# Patient Record
Sex: Male | Born: 1985 | Race: White | Hispanic: No | Marital: Married | Smoking: Never smoker
Health system: Southern US, Community
[De-identification: ages and names within clinical notes are randomized; demographics above are authoritative.]

---
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
## 2003-02-01 ENCOUNTER — Emergency Department (HOSPITAL_COMMUNITY): Admission: EM | Admit: 2003-02-01 | Discharge: 2003-02-01 | Payer: Self-pay | Admitting: Emergency Medicine

## 2003-02-25 ENCOUNTER — Emergency Department (HOSPITAL_COMMUNITY): Admission: EM | Admit: 2003-02-25 | Discharge: 2003-02-25 | Payer: Self-pay | Admitting: Emergency Medicine

## 2003-09-14 ENCOUNTER — Emergency Department (HOSPITAL_COMMUNITY): Admission: EM | Admit: 2003-09-14 | Discharge: 2003-09-15 | Payer: Self-pay | Admitting: Emergency Medicine

## 2003-09-17 ENCOUNTER — Emergency Department (HOSPITAL_COMMUNITY): Admission: EM | Admit: 2003-09-17 | Discharge: 2003-09-18 | Payer: Self-pay | Admitting: Emergency Medicine

## 2004-06-14 ENCOUNTER — Emergency Department (HOSPITAL_COMMUNITY): Admission: EM | Admit: 2004-06-14 | Discharge: 2004-06-14 | Payer: Self-pay | Admitting: Emergency Medicine

## 2004-06-18 ENCOUNTER — Emergency Department (HOSPITAL_COMMUNITY): Admission: EM | Admit: 2004-06-18 | Discharge: 2004-06-18 | Payer: Self-pay | Admitting: Emergency Medicine

## 2004-06-22 ENCOUNTER — Emergency Department (HOSPITAL_COMMUNITY): Admission: EM | Admit: 2004-06-22 | Discharge: 2004-06-23 | Payer: Self-pay | Admitting: *Deleted

## 2008-08-05 ENCOUNTER — Emergency Department (HOSPITAL_COMMUNITY): Admission: EM | Admit: 2008-08-05 | Discharge: 2008-08-05 | Payer: Self-pay | Admitting: Emergency Medicine

## 2008-09-21 ENCOUNTER — Emergency Department (HOSPITAL_COMMUNITY): Admission: EM | Admit: 2008-09-21 | Discharge: 2008-09-21 | Payer: Self-pay | Admitting: Emergency Medicine

## 2008-12-30 ENCOUNTER — Emergency Department (HOSPITAL_COMMUNITY): Admission: EM | Admit: 2008-12-30 | Discharge: 2008-12-31 | Payer: Self-pay | Admitting: Emergency Medicine

## 2010-03-31 ENCOUNTER — Emergency Department (HOSPITAL_COMMUNITY): Payer: PRIVATE HEALTH INSURANCE

## 2010-03-31 ENCOUNTER — Emergency Department (HOSPITAL_COMMUNITY)
Admission: EM | Admit: 2010-03-31 | Discharge: 2010-04-01 | Disposition: A | Discharge status: Home / Self Care | Payer: PRIVATE HEALTH INSURANCE | Attending: Emergency Medicine | Admitting: Emergency Medicine

## 2010-03-31 DIAGNOSIS — S335XXA Sprain of ligaments of lumbar spine, initial encounter: Secondary | ICD-10-CM | POA: Insufficient documentation

## 2010-03-31 DIAGNOSIS — Y92009 Unspecified place in unspecified non-institutional (private) residence as the place of occurrence of the external cause: Secondary | ICD-10-CM | POA: Insufficient documentation

## 2010-03-31 DIAGNOSIS — M542 Cervicalgia: Secondary | ICD-10-CM | POA: Insufficient documentation

## 2010-03-31 DIAGNOSIS — M545 Low back pain, unspecified: Secondary | ICD-10-CM | POA: Insufficient documentation

## 2010-03-31 DIAGNOSIS — W010XXA Fall on same level from slipping, tripping and stumbling without subsequent striking against object, initial encounter: Secondary | ICD-10-CM | POA: Insufficient documentation

## 2010-03-31 DIAGNOSIS — R51 Headache: Secondary | ICD-10-CM | POA: Insufficient documentation

## 2010-04-05 ENCOUNTER — Emergency Department (HOSPITAL_COMMUNITY): Payer: PRIVATE HEALTH INSURANCE

## 2010-04-05 ENCOUNTER — Emergency Department (HOSPITAL_COMMUNITY)
Admission: EM | Admit: 2010-04-05 | Discharge: 2010-04-05 | Disposition: A | Discharge status: Home / Self Care | Payer: PRIVATE HEALTH INSURANCE | Attending: Emergency Medicine | Admitting: Emergency Medicine

## 2010-04-05 DIAGNOSIS — R51 Headache: Secondary | ICD-10-CM | POA: Insufficient documentation

## 2010-04-05 DIAGNOSIS — S0003XA Contusion of scalp, initial encounter: Secondary | ICD-10-CM | POA: Insufficient documentation

## 2010-04-05 DIAGNOSIS — M545 Low back pain, unspecified: Secondary | ICD-10-CM | POA: Insufficient documentation

## 2010-04-05 DIAGNOSIS — Y92009 Unspecified place in unspecified non-institutional (private) residence as the place of occurrence of the external cause: Secondary | ICD-10-CM | POA: Insufficient documentation

## 2010-04-05 DIAGNOSIS — S1093XA Contusion of unspecified part of neck, initial encounter: Secondary | ICD-10-CM | POA: Insufficient documentation

## 2010-04-05 DIAGNOSIS — W19XXXA Unspecified fall, initial encounter: Secondary | ICD-10-CM | POA: Insufficient documentation

## 2010-04-05 DIAGNOSIS — M549 Dorsalgia, unspecified: Secondary | ICD-10-CM | POA: Insufficient documentation

## 2010-06-01 NOTE — Op Note (Signed)
[facility]  Patient:    Alan Ryan, Alan Ryan Visit Number: [id number] MRN: [id number]          Service Type: DSU Location: DAY Attending Physician:  Barbaraann Barthel Dictated by:   Barbaraann Barthel, M.D. Proc. Date: 11/26/00 Admit Date:  11/26/2000                             Operative Report  PREOPERATIVE DIAGNOSIS:  Left groin mass.  POSTOPERATIVE DIAGNOSIS:  Left groin mass.  OPERATION:  Biopsy of left groin node.  SURGEON:  Barbaraann Barthel, M.D.  NOTE: This is a 25 year old white male who had presented to the office with hard palpable lymph nodes primarily in the left groin.  These were nontender, and there were no other lymph nodes palpable other than primarily in his left groin side.  He had several large, hard lymph nodes palpated in this area. The patient had no history of any unusual fevers, night sweats, fatigue, change of appetite, rashes, or any other systemic symptoms.  The patient did have a history of a penile piercing, and he presented to the office with a stainless steel bolt through the prepuce of his penis.  This was not infected. Chest x-ray was normal, and laboratory work otherwise was grossly within normal limits.  I discussed the need for biopsy in one of these hard lymph nodes for diagnosis, and informed consent was obtained.  We discussed complications not limited to but including bleeding and infection and the possibility that more surgery might be required.  Informed consent was obtained with the patient and his mother.  GROSS OPERATIVE FINDINGS:  The patient had a fleshy-appearing lymph node that was hard in his left groin area.  This was approximately the size of an almond.  He had larger other lymph nodes, but I chose this particular lymph node for easy accessibility.  TECHNIQUE:  The patient was placed in the supine position.  After the adequate administration of general anesthesia by endotracheal intubation,  his left groin area was shaved and prepped with Betadine solution and draped in the usual manner.  The palpable lymph node was marked with a sterile marking pen, and a transverse incision was carried out over this palpable mass, and this was removed, ligating the pedicle with 2-0 Polysorb sutures.  The wound was then irrigated with normal saline solution and closed with 4-0 Polysorb sutures with Steri-Strips used to further approximate the skin.  Sterile dressing with Neosporin and 4 x 4 was applied.  The specimen was sent as a fresh specimen as previously discussed preoperatively with the pathologist. Final pathology is pending. Dictated by:   Barbaraann Barthel, M.D. Attending Physician:  Barbaraann Barthel DD:  11/26/00 TD:  11/26/00 Job: [id number]

## 2013-02-13 IMAGING — CT CT HEAD W/O CM
4 of 5 series · 12 of 47 positions shown, 13 images · non-contrast
Comparison: CT head and cervical spine 03/31/2010

CT HEAD

CLINICAL DATA: Trauma to head.  Struck forehead on sink.  Headache
and posterior neck pain.

CT HEAD WITHOUT CONTRAST
CT CERVICAL SPINE WITHOUT CONTRAST
TECHNIQUE: Multidetector CT imaging of the head and cervical spine
was performed following the standard protocol without intravenous
contrast.  Multiplanar CT image reconstructions of the cervical
spine were also generated.

[Series 2: headseq 4.8 h37s · axial · 0.47mm/px · z∈[+243,+313]mm · 3 of 30 slices shown, 4 images]
[im 8/30  brain]
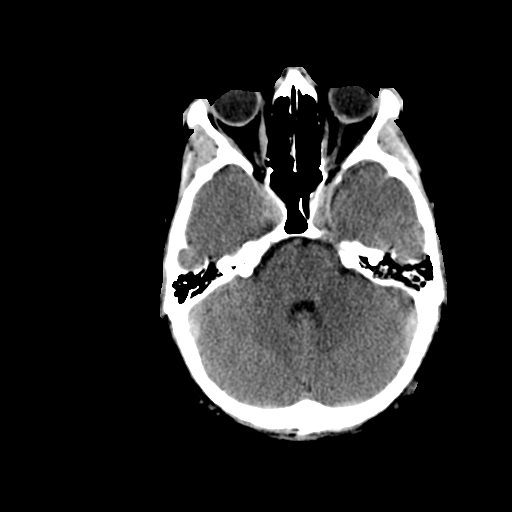
[im 8/30  bone]
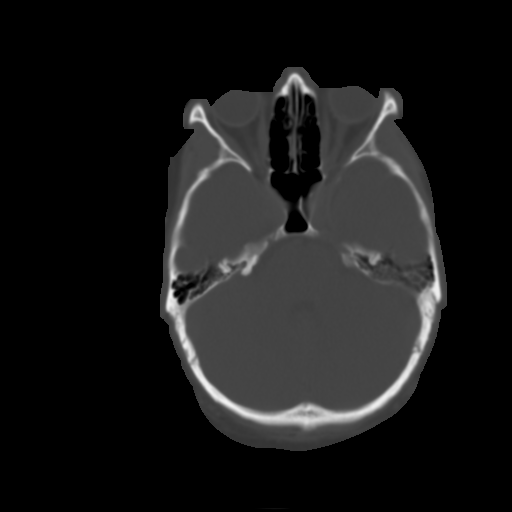
[im 15/30  brain]
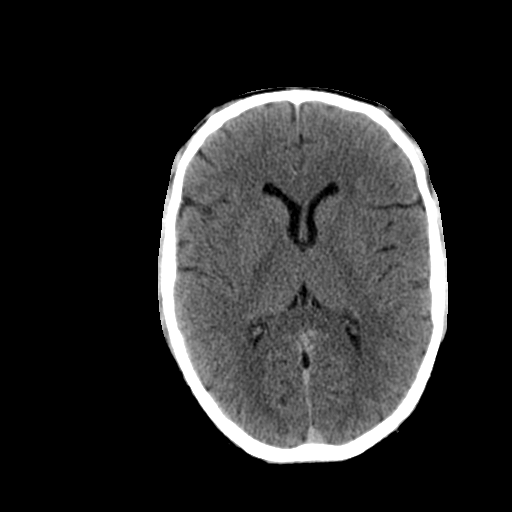
[im 22/30  brain]
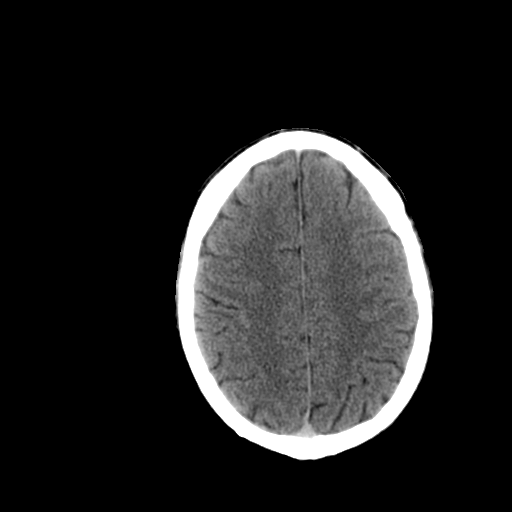

[Series 7: sagittal bone 2.0 · sagittal · 0.16mm/px · 3 of 35 slices shown]
[im 12/35  brain]
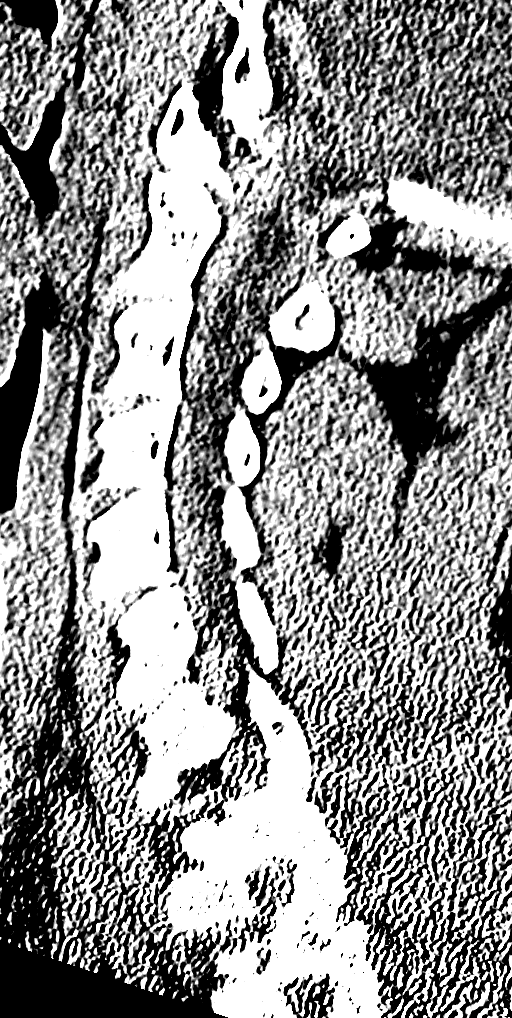
[im 18/35  brain]
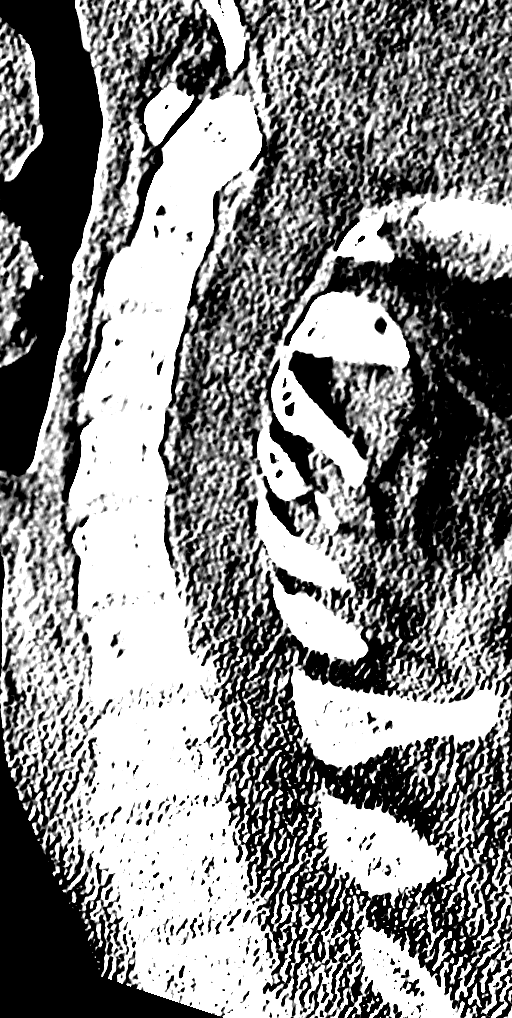
[im 23/35  brain]
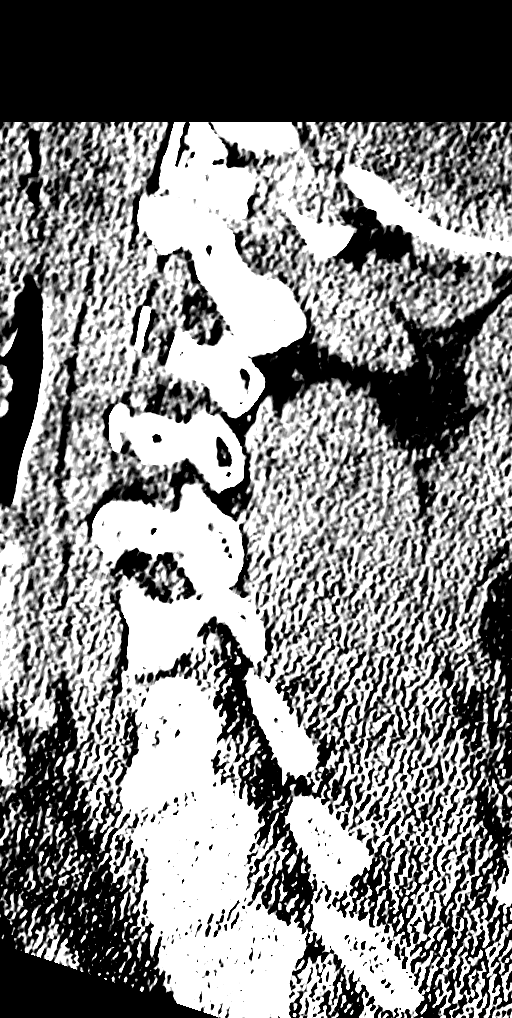

[Series 8: coronal bone 2.0 · coronal · 0.15mm/px · 3 of 43 slices shown]
[im 15/43  brain]
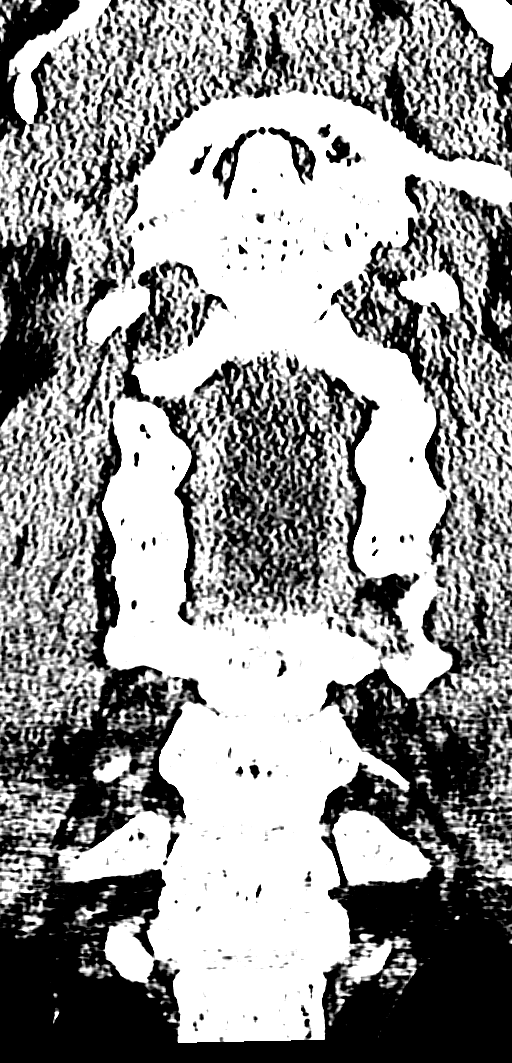
[im 19/43  brain]
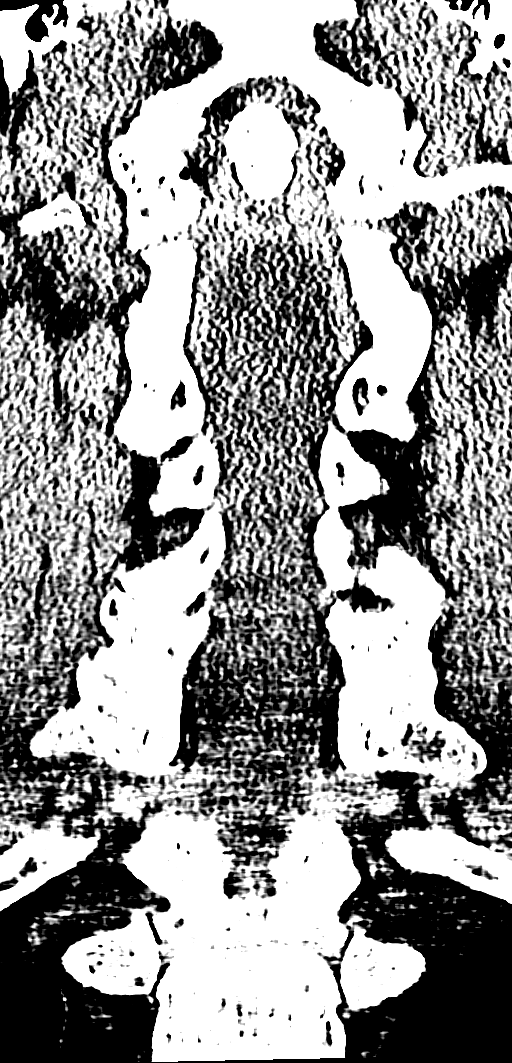
[im 24/43  brain]
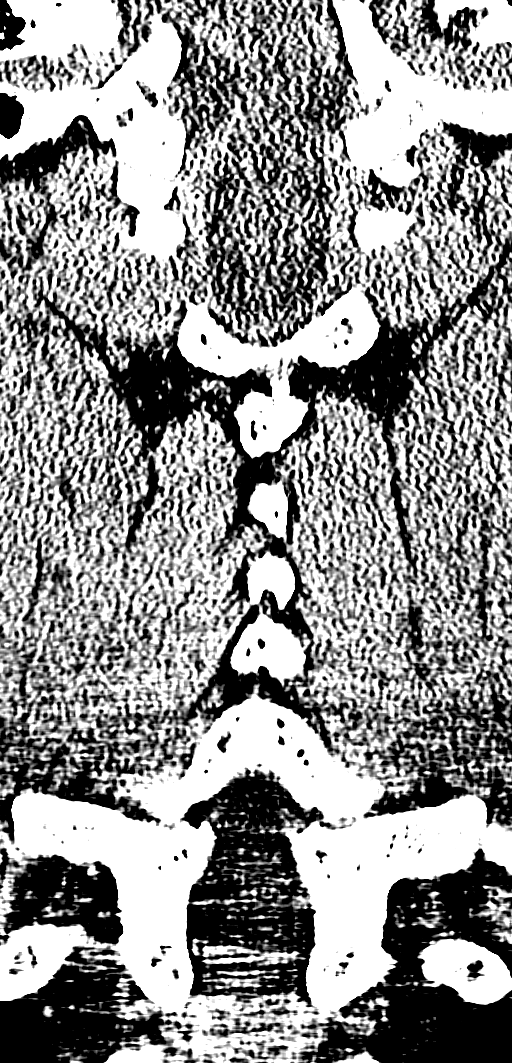

[Series 9: axial bone 2.0 · axial · 0.17mm/px · z∈[+39,+80]mm · 3 of 81 slices shown]
[im 8/81  bone]
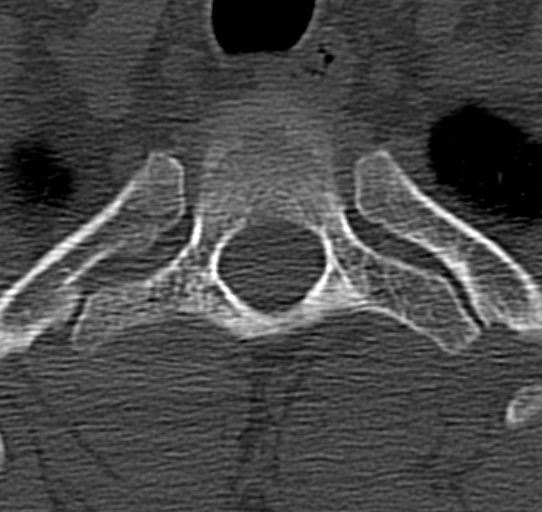
[im 15/81  bone]
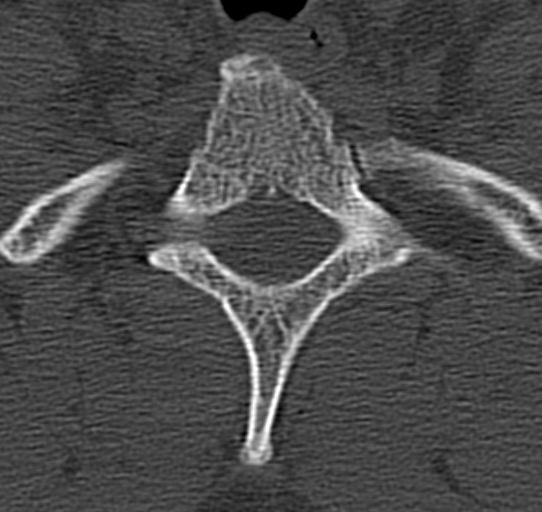
[im 30/81  bone]
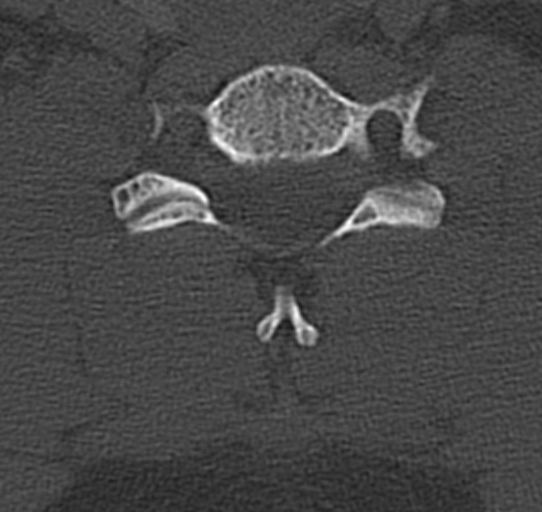

[12 of 47 positions shown; findings below may reference images not displayed]

FINDINGS: No acute cortical infarct, hemorrhage, or mass lesion is
present.  The ventricles are normal size.  No significant extra-
axial fluid collection is present.  The paranasal sinuses and
mastoid air cells are clear.  The osseous skull is intact.
Rightward nasal deviation is stable.  No acute extracranial injury
is evident.
IMPRESSION: Negative CT of the head.

CT CERVICAL SPINE
FINDINGS: The cervical spine is imaged from the skull base through
T1-2.  The vertebral body heights and alignment are maintained.  No
acute fracture or traumatic subluxation is evident.  An azygos
fissure is evident.  The lung apices are otherwise clear.
IMPRESSION: No acute abnormality of the cervical spine.

## 2020-07-05 ENCOUNTER — Emergency Department (HOSPITAL_COMMUNITY)
Admission: EM | Admit: 2020-07-05 | Discharge: 2020-07-05 | Disposition: A | Discharge status: Home / Self Care | Payer: PRIVATE HEALTH INSURANCE | Attending: Emergency Medicine | Admitting: Emergency Medicine

## 2020-07-05 ENCOUNTER — Encounter (HOSPITAL_COMMUNITY): Payer: Self-pay

## 2020-07-05 ENCOUNTER — Other Ambulatory Visit: Payer: Self-pay

## 2020-07-05 DIAGNOSIS — T63444A Toxic effect of venom of bees, undetermined, initial encounter: Secondary | ICD-10-CM

## 2020-07-05 DIAGNOSIS — T63441A Toxic effect of venom of bees, accidental (unintentional), initial encounter: Secondary | ICD-10-CM | POA: Insufficient documentation

## 2020-07-05 DIAGNOSIS — R000 Tachycardia, unspecified: Secondary | ICD-10-CM | POA: Insufficient documentation

## 2020-07-05 DIAGNOSIS — R1310 Dysphagia, unspecified: Secondary | ICD-10-CM | POA: Insufficient documentation

## 2020-07-05 DIAGNOSIS — T7840XA Allergy, unspecified, initial encounter: Secondary | ICD-10-CM

## 2020-07-05 DIAGNOSIS — R109 Unspecified abdominal pain: Secondary | ICD-10-CM | POA: Insufficient documentation

## 2020-07-05 DIAGNOSIS — R21 Rash and other nonspecific skin eruption: Secondary | ICD-10-CM | POA: Insufficient documentation

## 2020-07-05 MED ORDER — SODIUM CHLORIDE 0.9 % IV BOLUS
1000.0000 mL | Freq: Once | INTRAVENOUS | Status: AC
  Administered 2020-07-05: 1000 mL via INTRAVENOUS

## 2020-07-05 MED ORDER — EPINEPHRINE 0.3 MG/0.3ML IJ SOAJ
0.3000 mg | Freq: Once | INTRAMUSCULAR | Status: AC
  Administered 2020-07-05: 0.3 mg via INTRAMUSCULAR
  Filled 2020-07-05: qty 0.3

## 2020-07-05 MED ORDER — EPINEPHRINE 0.3 MG/0.3ML IJ SOAJ: 0.3000 mg | Freq: Once | INTRAMUSCULAR | Status: DC

## 2020-07-05 MED ORDER — FAMOTIDINE IN NACL 20-0.9 MG/50ML-% IV SOLN
20.0000 mg | Freq: Once | INTRAVENOUS | Status: AC
  Administered 2020-07-05: 20 mg via INTRAVENOUS
  Filled 2020-07-05: qty 50

## 2020-07-05 MED ORDER — ALBUTEROL SULFATE (2.5 MG/3ML) 0.083% IN NEBU
5.0000 mg | INHALATION_SOLUTION | Freq: Once | RESPIRATORY_TRACT | Status: AC
  Administered 2020-07-05: 5 mg via RESPIRATORY_TRACT
  Filled 2020-07-05: qty 6

## 2020-07-05 MED ORDER — ONDANSETRON HCL 4 MG/2ML IJ SOLN
4.0000 mg | Freq: Once | INTRAMUSCULAR | Status: AC
  Administered 2020-07-05: 4 mg via INTRAVENOUS
  Filled 2020-07-05: qty 2

## 2020-07-05 MED ORDER — DIPHENHYDRAMINE HCL 50 MG/ML IJ SOLN
25.0000 mg | Freq: Once | INTRAMUSCULAR | Status: AC
  Administered 2020-07-05: 25 mg via INTRAVENOUS
  Filled 2020-07-05: qty 1

## 2020-07-05 MED ORDER — METHYLPREDNISOLONE SODIUM SUCC 125 MG IJ SOLR
125.0000 mg | Freq: Once | INTRAMUSCULAR | Status: AC
  Administered 2020-07-05: 125 mg via INTRAVENOUS
  Filled 2020-07-05: qty 2

## 2020-07-05 MED ORDER — EPINEPHRINE 0.3 MG/0.3ML IJ SOAJ: 0.3000 mg | INTRAMUSCULAR | 1 refills | Status: AC | PRN

## 2020-07-05 MED ORDER — HYDROMORPHONE HCL 1 MG/ML IJ SOLN
0.5000 mg | Freq: Once | INTRAMUSCULAR | Status: AC
  Administered 2020-07-05: 0.5 mg via INTRAVENOUS
  Filled 2020-07-05: qty 1

## 2020-07-05 NOTE — ED Notes (Signed)
Pt educated on dilauidid medication administration and waiting four hours from admin time to driving as he is up for discharge. Pt reports he has no one else to drive him home and does not have transportation apps, denied using a cab. Pt reports he will walk home at this time after education and encouragement of waiting in ED.

## 2020-07-05 NOTE — ED Provider Notes (Signed)
[facility] EMERGENCY DEPARTMENT Provider Note   CSN: [id number] Arrival date & time: 07/05/20  1821     History Chief Complaint  Patient presents with   Insect Bite    Alan Ryan is a 35 y.o. male.  The history is provided by the patient. No language interpreter was used.  Allergic Reaction Presenting symptoms: difficulty breathing, difficulty swallowing, itching and rash   Severity:  Severe Duration:  20 minutes Prior allergic episodes:  Insect allergies Context: insect bite/sting   Relieved by:  Nothing Worsened by:  Nothing Ineffective treatments:  None tried   Pt complains of being stung by a bee.  Pt complains of a full body rash, itching, hands swelling and abdominal cramps.  Pt reports he had a previous reaction to bee stings in the past.   No past medical history on file.  There are no problems to display for this patient.        No family history on file.  Social History   Tobacco Use   Smoking status: Never   Smokeless tobacco: Never  Substance Use Topics   Alcohol use: Yes    Home Medications Prior to Admission medications   Not on File    Allergies    Bee venom, Shellfish allergy, and Sunflower seed oil [sunflower oil]  Review of Systems   Review of Systems  HENT:  Positive for trouble swallowing.   Skin:  Positive for itching and rash.  All other systems reviewed and are negative.  Physical Exam Updated Vital Signs BP 113/72   Pulse 97   Temp 98.4 F (36.9 C) (Oral)   Resp (!) 26   Ht 5\' 6"  (1.676 m)   Wt 86.2 kg   SpO2 94%   BMI 30.67 kg/m   Physical Exam Vitals reviewed.  Constitutional:      General: He is in acute distress.  HENT:     Head: Normocephalic.     Nose: Nose normal.     Mouth/Throat:     Mouth: Mucous membranes are moist.  Eyes:     Pupils: Pupils are equal, round, and reactive to light.  Cardiovascular:     Rate and Rhythm: Tachycardia present.  Pulmonary:     Comments: Coughing, increased  secretions  Abdominal:     General: Abdomen is flat.  Musculoskeletal:        General: Swelling present.     Cervical back: Normal range of motion.     Comments: Swelling bilat hands.    Skin:    Findings: Erythema and rash present.     Comments: Hives full body  Neurological:     General: No focal deficit present.     Mental Status: He is alert.  Psychiatric:        Mood and Affect: Mood normal.    ED Results / Procedures / Treatments   Labs (all labs ordered are listed, but only abnormal results are displayed) Labs Reviewed - No data to display  EKG None  Radiology No results found.  Procedures .Critical Care  Date/Time: 07/05/2020 10:53 PM Performed by: 07/07/2020, PA-C Authorized by: Elson Areas, PA-C   Critical care provider statement:    Critical care time (minutes):  45   Critical care start time:  07/05/2020 6:50 PM   Critical care end time:  07/05/2020 10:53 PM   Critical care time was exclusive of:  Separately billable procedures and treating other patients   Critical care was necessary  to treat or prevent imminent or life-threatening deterioration of the following conditions:  Respiratory failure   Critical care was time spent personally by me on the following activities:  Pulse oximetry, re-evaluation of patient's condition, ordering and performing treatments and interventions and evaluation of patient's response to treatment   I assumed direction of critical care for this patient from another provider in my specialty: no     Medications Ordered in ED Medications  EPINEPHrine (EPI-PEN) injection 0.3 mg (0.3 mg Intramuscular Given 07/05/20 1905)  methylPREDNISolone sodium succinate (SOLU-MEDROL) 125 mg/2 mL injection 125 mg (125 mg Intravenous Given 07/05/20 1904)  famotidine (PEPCID) IVPB 20 mg premix (0 mg Intravenous Stopped 07/05/20 1942)  diphenhydrAMINE (BENADRYL) injection 25 mg (25 mg Intravenous Given 07/05/20 1906)  sodium chloride 0.9 % bolus  1,000 mL (0 mLs Intravenous Stopped 07/05/20 1955)  albuterol (PROVENTIL) (2.5 MG/3ML) 0.083% nebulizer solution 5 mg (5 mg Nebulization Given 07/05/20 1922)  HYDROmorphone (DILAUDID) injection 0.5 mg (0.5 mg Intravenous Given 07/05/20 2038)  ondansetron (ZOFRAN) injection 4 mg (4 mg Intravenous Given 07/05/20 2038)    ED Course  I have reviewed the triage vital signs and the nursing notes.  Pertinent labs & imaging results that were available during my care of the patient were reviewed by me and considered in my medical decision making (see chart for details).    MDM Rules/Calculators/A&P                          MDM:  Pt had resolution of symptoms with treatment.  Pt observed for 4 hours post epi injection.  Pt advised to use epipen if any future bee stings.  Final Clinical Impression(s) / ED Diagnoses Final diagnoses:  Allergic reaction, initial encounter  Bee sting reaction, undetermined intent, initial encounter    Rx / DC Orders ED Discharge Orders     None     An After Visit Summary was printed and given to the patient.    Osie Cheeks 07/05/20 2312    Cheryll Cockayne, MD 07/14/20 [phone]

## 2020-07-05 NOTE — ED Triage Notes (Signed)
Pt reports being stung by a bee appox. 10 min PTA. Hx of allergies to same. C/o headache, dizziness, sore throat, and epigastric pain.

## 2020-07-05 NOTE — Discharge Instructions (Addendum)
Use epipen if future bee stings.

## 2020-07-05 NOTE — ED Notes (Signed)
Pt alert and oriented x 4 resting in bed, skin relieved of hives, pt denies any difficulty breathing.
